# Patient Record
Sex: Male | Born: 1980 | Race: White | Hispanic: No | Marital: Single
Health system: Southern US, Community
[De-identification: ages and names within clinical notes are randomized; demographics above are authoritative.]

---

## 2010-10-27 ENCOUNTER — Emergency Department: Payer: Self-pay | Admitting: Emergency Medicine

## 2011-09-28 ENCOUNTER — Emergency Department: Payer: Self-pay | Admitting: Emergency Medicine

## 2011-12-26 ENCOUNTER — Emergency Department: Payer: Self-pay | Admitting: Emergency Medicine

## 2011-12-27 ENCOUNTER — Emergency Department: Payer: Self-pay | Admitting: Emergency Medicine

## 2014-01-20 ENCOUNTER — Inpatient Hospital Stay: Payer: Self-pay | Admitting: Internal Medicine

## 2014-01-20 LAB — CBC WITH DIFFERENTIAL/PLATELET
Basophil #: 0.1 10*3/uL (ref 0.0–0.1)
Basophil %: 0.5 %
Eosinophil #: 0.4 10*3/uL (ref 0.0–0.7)
Eosinophil %: 3.1 %
HCT: 37.4 % — ABNORMAL LOW (ref 40.0–52.0)
HGB: 12.2 g/dL — AB (ref 13.0–18.0)
LYMPHS PCT: 17 %
Lymphocyte #: 2.1 10*3/uL (ref 1.0–3.6)
MCH: 27.7 pg (ref 26.0–34.0)
MCHC: 32.6 g/dL (ref 32.0–36.0)
MCV: 85 fL (ref 80–100)
MONO ABS: 0.9 x10 3/mm (ref 0.2–1.0)
MONOS PCT: 7.2 %
Neutrophil #: 8.8 10*3/uL — ABNORMAL HIGH (ref 1.4–6.5)
Neutrophil %: 72.2 %
Platelet: 355 10*3/uL (ref 150–440)
RBC: 4.4 10*6/uL (ref 4.40–5.90)
RDW: 13.5 % (ref 11.5–14.5)
WBC: 12.2 10*3/uL — ABNORMAL HIGH (ref 3.8–10.6)

## 2014-01-20 LAB — BASIC METABOLIC PANEL
Anion Gap: 4 — ABNORMAL LOW (ref 7–16)
BUN: 11 mg/dL (ref 7–18)
CALCIUM: 8.5 mg/dL (ref 8.5–10.1)
Chloride: 99 mmol/L (ref 98–107)
Co2: 35 mmol/L — ABNORMAL HIGH (ref 21–32)
Creatinine: 0.95 mg/dL (ref 0.60–1.30)
EGFR (African American): >60
EGFR (Non-African Amer.): >60
Glucose: 94 mg/dL (ref 65–99)
Osmolality: 275 (ref 275–301)
Potassium: 4.2 mmol/L (ref 3.5–5.1)
Sodium: 138 mmol/L (ref 136–145)

## 2014-01-20 LAB — MONONUCLEOSIS SCREEN: Mono Test: NEGATIVE

## 2014-01-21 LAB — BASIC METABOLIC PANEL
ANION GAP: 5 — AB (ref 7–16)
BUN: 8 mg/dL (ref 7–18)
Calcium, Total: 8.6 mg/dL (ref 8.5–10.1)
Chloride: 104 mmol/L (ref 98–107)
Co2: 29 mmol/L (ref 21–32)
Creatinine: 0.74 mg/dL (ref 0.60–1.30)
EGFR (African American): >60
EGFR (Non-African Amer.): >60
Glucose: 132 mg/dL — ABNORMAL HIGH (ref 65–99)
Osmolality: 276 (ref 275–301)
POTASSIUM: 4 mmol/L (ref 3.5–5.1)
Sodium: 138 mmol/L (ref 136–145)

## 2014-01-21 LAB — CBC WITH DIFFERENTIAL/PLATELET
BASOS ABS: 0 10*3/uL (ref 0.0–0.1)
BASOS PCT: 0.1 %
EOS ABS: 0 10*3/uL (ref 0.0–0.7)
EOS PCT: 0.1 %
HCT: 37.5 % — AB (ref 40.0–52.0)
HGB: 12.1 g/dL — AB (ref 13.0–18.0)
LYMPHS ABS: 1 10*3/uL (ref 1.0–3.6)
Lymphocyte %: 11.3 %
MCH: 27.4 pg (ref 26.0–34.0)
MCHC: 32.3 g/dL (ref 32.0–36.0)
MCV: 85 fL (ref 80–100)
MONOS PCT: 0.7 %
Monocyte #: 0.1 x10 3/mm — ABNORMAL LOW (ref 0.2–1.0)
NEUTROS PCT: 87.8 %
Neutrophil #: 7.5 10*3/uL — ABNORMAL HIGH (ref 1.4–6.5)
PLATELETS: 344 10*3/uL (ref 150–440)
RBC: 4.42 10*6/uL (ref 4.40–5.90)
RDW: 13.6 % (ref 11.5–14.5)
WBC: 8.5 10*3/uL (ref 3.8–10.6)

## 2014-01-21 LAB — T4, FREE: Free Thyroxine: 1.58 ng/dL — ABNORMAL HIGH (ref 0.76–1.46)

## 2014-01-21 LAB — TSH: Thyroid Stimulating Horm: 0.239 u[IU]/mL — ABNORMAL LOW

## 2014-01-22 LAB — BASIC METABOLIC PANEL
ANION GAP: 2 — AB (ref 7–16)
BUN: 7 mg/dL (ref 7–18)
CALCIUM: 7.9 mg/dL — AB (ref 8.5–10.1)
CHLORIDE: 107 mmol/L (ref 98–107)
CO2: 30 mmol/L (ref 21–32)
Creatinine: 0.71 mg/dL (ref 0.60–1.30)
EGFR (African American): >60
EGFR (Non-African Amer.): >60
Glucose: 196 mg/dL — ABNORMAL HIGH (ref 65–99)
Osmolality: 281 (ref 275–301)
Potassium: 4.4 mmol/L (ref 3.5–5.1)
SODIUM: 139 mmol/L (ref 136–145)

## 2014-01-22 LAB — CBC WITH DIFFERENTIAL/PLATELET
BASOS ABS: 0 10*3/uL (ref 0.0–0.1)
BASOS PCT: 0.1 %
EOS ABS: 0 10*3/uL (ref 0.0–0.7)
EOS PCT: 0 %
HCT: 32.9 % — ABNORMAL LOW (ref 40.0–52.0)
HGB: 10.5 g/dL — AB (ref 13.0–18.0)
Lymphocyte #: 1.3 10*3/uL (ref 1.0–3.6)
Lymphocyte %: 16.3 %
MCH: 27.2 pg (ref 26.0–34.0)
MCHC: 31.9 g/dL — ABNORMAL LOW (ref 32.0–36.0)
MCV: 85 fL (ref 80–100)
MONO ABS: 0.2 x10 3/mm (ref 0.2–1.0)
Monocyte %: 2 %
NEUTROS ABS: 6.6 10*3/uL — AB (ref 1.4–6.5)
Neutrophil %: 81.6 %
Platelet: 298 10*3/uL (ref 150–440)
RBC: 3.85 10*6/uL — AB (ref 4.40–5.90)
RDW: 13.8 % (ref 11.5–14.5)
WBC: 8 10*3/uL (ref 3.8–10.6)

## 2014-01-22 LAB — VANCOMYCIN, TROUGH: Vancomycin, Trough: 14 ug/mL (ref 10–20)

## 2014-01-22 LAB — BETA STREP CULTURE(ARMC)

## 2014-01-25 LAB — CULTURE, BLOOD (SINGLE)

## 2014-02-06 ENCOUNTER — Emergency Department: Payer: Self-pay | Admitting: Student

## 2014-02-06 LAB — COMPREHENSIVE METABOLIC PANEL
ALBUMIN: 3.4 g/dL (ref 3.4–5.0)
AST: 76 U/L — AB (ref 15–37)
Alkaline Phosphatase: 80 U/L
Anion Gap: 6 — ABNORMAL LOW (ref 7–16)
BILIRUBIN TOTAL: 0.9 mg/dL (ref 0.2–1.0)
BUN: 8 mg/dL (ref 7–18)
CHLORIDE: 102 mmol/L (ref 98–107)
CREATININE: 1.04 mg/dL (ref 0.60–1.30)
Calcium, Total: 8.3 mg/dL — ABNORMAL LOW (ref 8.5–10.1)
Co2: 29 mmol/L (ref 21–32)
EGFR (Non-African Amer.): >60
Glucose: 82 mg/dL (ref 65–99)
Osmolality: 271 (ref 275–301)
Potassium: 4.1 mmol/L (ref 3.5–5.1)
SGPT (ALT): 308 U/L — ABNORMAL HIGH
SODIUM: 137 mmol/L (ref 136–145)
Total Protein: 7.8 g/dL (ref 6.4–8.2)

## 2014-02-06 LAB — CBC WITH DIFFERENTIAL/PLATELET
BASOS ABS: 0.1 10*3/uL (ref 0.0–0.1)
Basophil %: 0.3 %
EOS PCT: 0.2 %
Eosinophil #: 0 10*3/uL (ref 0.0–0.7)
HCT: 40.8 % (ref 40.0–52.0)
HGB: 13.1 g/dL (ref 13.0–18.0)
Lymphocyte #: 2.1 10*3/uL (ref 1.0–3.6)
Lymphocyte %: 11.5 %
MCH: 27.2 pg (ref 26.0–34.0)
MCHC: 32.2 g/dL (ref 32.0–36.0)
MCV: 85 fL (ref 80–100)
MONOS PCT: 6.2 %
Monocyte #: 1.1 x10 3/mm — ABNORMAL HIGH (ref 0.2–1.0)
NEUTROS ABS: 15 10*3/uL — AB (ref 1.4–6.5)
Neutrophil %: 81.8 %
Platelet: 324 10*3/uL (ref 150–440)
RBC: 4.82 10*6/uL (ref 4.40–5.90)
RDW: 15 % — ABNORMAL HIGH (ref 11.5–14.5)
WBC: 18.4 10*3/uL — ABNORMAL HIGH (ref 3.8–10.6)

## 2014-02-08 ENCOUNTER — Emergency Department: Payer: Self-pay | Admitting: Emergency Medicine

## 2014-02-08 LAB — CBC WITH DIFFERENTIAL/PLATELET
BASOS ABS: 0.1 10*3/uL (ref 0.0–0.1)
BASOS PCT: 0.8 %
EOS ABS: 0.5 10*3/uL (ref 0.0–0.7)
Eosinophil %: 7.2 %
HCT: 39.4 % — AB (ref 40.0–52.0)
HGB: 12.5 g/dL — AB (ref 13.0–18.0)
LYMPHS ABS: 1.8 10*3/uL (ref 1.0–3.6)
Lymphocyte %: 24.7 %
MCH: 27.3 pg (ref 26.0–34.0)
MCHC: 31.7 g/dL — ABNORMAL LOW (ref 32.0–36.0)
MCV: 86 fL (ref 80–100)
MONO ABS: 0.4 x10 3/mm (ref 0.2–1.0)
Monocyte %: 6.2 %
Neutrophil #: 4.4 10*3/uL (ref 1.4–6.5)
Neutrophil %: 61.1 %
Platelet: 294 10*3/uL (ref 150–440)
RBC: 4.58 10*6/uL (ref 4.40–5.90)
RDW: 15.1 % — ABNORMAL HIGH (ref 11.5–14.5)
WBC: 7.2 10*3/uL (ref 3.8–10.6)

## 2014-02-08 LAB — COMPREHENSIVE METABOLIC PANEL
AST: 94 U/L — AB (ref 15–37)
Albumin: 2.9 g/dL — ABNORMAL LOW (ref 3.4–5.0)
Alkaline Phosphatase: 67 U/L
Anion Gap: 4 — ABNORMAL LOW (ref 7–16)
BUN: 3 mg/dL — AB (ref 7–18)
Bilirubin,Total: 0.4 mg/dL (ref 0.2–1.0)
CALCIUM: 8 mg/dL — AB (ref 8.5–10.1)
CHLORIDE: 108 mmol/L — AB (ref 98–107)
Co2: 29 mmol/L (ref 21–32)
Creatinine: 0.67 mg/dL (ref 0.60–1.30)
EGFR (Non-African Amer.): >60
Glucose: 79 mg/dL (ref 65–99)
OSMOLALITY: 277 (ref 275–301)
POTASSIUM: 3.8 mmol/L (ref 3.5–5.1)
SGPT (ALT): 235 U/L — ABNORMAL HIGH
Sodium: 141 mmol/L (ref 136–145)
TOTAL PROTEIN: 6.7 g/dL (ref 6.4–8.2)

## 2014-02-11 ENCOUNTER — Emergency Department: Payer: Self-pay | Admitting: Emergency Medicine

## 2014-02-11 LAB — CULTURE, BLOOD (SINGLE)

## 2014-03-08 ENCOUNTER — Emergency Department: Payer: Self-pay | Admitting: Emergency Medicine

## 2014-03-08 LAB — URINALYSIS, COMPLETE
Bacteria: NONE SEEN
Bilirubin,UR: NEGATIVE
Blood: NEGATIVE
GLUCOSE, UR: NEGATIVE mg/dL (ref 0–75)
Hyaline Cast: 2
Ketone: NEGATIVE
Leukocyte Esterase: NEGATIVE
Nitrite: NEGATIVE
Ph: 5 (ref 4.5–8.0)
Protein: NEGATIVE
Specific Gravity: 1.023 (ref 1.003–1.030)

## 2014-03-08 LAB — CBC
HCT: 43.9 % (ref 40.0–52.0)
HGB: 14.4 g/dL (ref 13.0–18.0)
MCH: 27.5 pg (ref 26.0–34.0)
MCHC: 32.8 g/dL (ref 32.0–36.0)
MCV: 84 fL (ref 80–100)
Platelet: 277 10*3/uL (ref 150–440)
RBC: 5.22 10*6/uL (ref 4.40–5.90)
RDW: 15.2 % — AB (ref 11.5–14.5)
WBC: 9.2 10*3/uL (ref 3.8–10.6)

## 2014-03-08 LAB — DRUG SCREEN, URINE
Amphetamines, Ur Screen: NEGATIVE (ref ?–1000)
BARBITURATES, UR SCREEN: NEGATIVE (ref ?–200)
BENZODIAZEPINE, UR SCRN: NEGATIVE (ref ?–200)
CANNABINOID 50 NG, UR ~~LOC~~: POSITIVE (ref ?–50)
COCAINE METABOLITE, UR ~~LOC~~: POSITIVE (ref ?–300)
MDMA (ECSTASY) UR SCREEN: NEGATIVE (ref ?–500)
Methadone, Ur Screen: NEGATIVE (ref ?–300)
Opiate, Ur Screen: NEGATIVE (ref ?–300)
PHENCYCLIDINE (PCP) UR S: NEGATIVE (ref ?–25)
TRICYCLIC, UR SCREEN: POSITIVE (ref ?–1000)

## 2014-03-08 LAB — COMPREHENSIVE METABOLIC PANEL
ALK PHOS: 75 U/L
ALT: 55 U/L
ANION GAP: 5 — AB (ref 7–16)
Albumin: 4.2 g/dL (ref 3.4–5.0)
BUN: 7 mg/dL (ref 7–18)
Bilirubin,Total: 1.2 mg/dL — ABNORMAL HIGH (ref 0.2–1.0)
CHLORIDE: 102 mmol/L (ref 98–107)
Calcium, Total: 8.4 mg/dL — ABNORMAL LOW (ref 8.5–10.1)
Co2: 30 mmol/L (ref 21–32)
Creatinine: 1.07 mg/dL (ref 0.60–1.30)
EGFR (African American): >60
GLUCOSE: 118 mg/dL — AB (ref 65–99)
Osmolality: 273 (ref 275–301)
POTASSIUM: 3.6 mmol/L (ref 3.5–5.1)
SGOT(AST): 27 U/L (ref 15–37)
Sodium: 137 mmol/L (ref 136–145)
Total Protein: 7.7 g/dL (ref 6.4–8.2)

## 2014-03-08 LAB — SALICYLATE LEVEL: Salicylates, Serum: <1.7 mg/dL

## 2014-03-08 LAB — ETHANOL: Ethanol: 43 mg/dL (ref 0–80)

## 2014-03-08 LAB — ACETAMINOPHEN LEVEL

## 2014-09-07 NOTE — Op Note (Signed)
PATIENT NAME:  Don AlarJONES, Kharson MR#:  [id number] DATE OF BIRTH:  November 17, 1980  DATE OF PROCEDURE:  01/20/2014  PREOPERATIVE DIAGNOSIS: Epiglottitis.  POSTOPERATIVE DIAGNOSIS: Epiglottitis.   PROCEDURE: Flexible fiberoptic nasal laryngoscopy.   SURGEON: Ollen Grossaul S. Willeen CassBennett, MD   ANESTHESIA: Topical.   INDICATIONS: The patient with right neck swelling and swelling involving the epiglottis seen on CT imaging.   FINDINGS: The epiglottis had edema without erythema which extended along the right aryepiglottic fold and down into the vallecula on the right side and the adjacent right piriform sinus and tongue base without exudate. Vocal cords were mobile and the airway was mildly narrowed but ultimately patent. There was no evidence of any ulceration, erosion, or mass lesion.   DESCRIPTION OF PROCEDURE: After discussing the procedure with the patient, the left nostril was anesthetized with 4% lidocaine drops. The scope was passed through the left nasal cavity and the hypopharynx, larynx, and tongue base carefully inspected with the scope. The patient was instructed to phonate to help visualize the vocal cords and vocal cord motion, which was normal. After completion of the exam, the scope was withdrawn. The patient tolerated the procedure quite well.   ____________________________ Ollen GrossPaul S. Willeen CassBennett, MD psb:ST D: 01/20/2014 19:57:52 ET T: 01/21/2014 00:58:34 ET JOB#: [id number]  cc: Ollen GrossPaul S. Willeen CassBennett, MD, <Dictator> Sandi MealyPAUL S Jaymi Tinner MD ELECTRONICALLY SIGNED 01/30/2014 8:23

## 2014-09-07 NOTE — Consult Note (Signed)
PATIENT NAME:  Don Peck, ANDER MR#:  [id number] DATE OF BIRTH:  [birth year]  DATE OF CONSULTATION:  01/20/2014  REFERRING PHYSICIAN:  Shreyang H. Allena Katz, MD CONSULTING PHYSICIAN:  Ollen Gross. Willeen Cass, MD  REASON FOR CONSULTATION: Epiglottitis with neck swelling.   HISTORY OF PRESENTING ILLNESS: A 34 year old male who presented to the Emergency Room today with 2 days of sore throat without fever. He had developed swelling in the right neck as well. He denies any preceding cold symptoms. He was having some difficulty swallowing, but no difficulty breathing. At the time of being seen in the Emergency Room, he was having severe right neck pain. CT scan was performed which showed diffuse inflammatory changes in the right neck and right side of the hypopharynx involving the epiglottis and vallecula, although the airway was patent. There was a borderline rim-enhancing fluid collection noted lateral to the hyoid bone on the right side as well as a second area between the thyroid cartilage and the strap muscles. I reviewed the imaging and felt this most likely represented an area of intense cellulitis without definitive abscess, and the radiologist also indicated in their interpretation that actual abscess was not definitive on the imaging. The patient has been admitted to the hospital by the hospitalist service and placed on IV vancomycin, Unasyn, and dexamethasone. Apparently, he had been given 1 dose of clindamycin in the Emergency Room. He has already noted substantial improvement since the initial doses and is swallowing better, and the pain in the neck is improved markedly. He is still not having any difficulty breathing whatsoever.   PAST MEDICAL HISTORY: None.   PAST SURGICAL HISTORY: Prior ankle fracture repair and lower back surgery.   ALLERGIES: None.  OUTPATIENT MEDICATIONS: None.   SOCIAL HISTORY: Smokes a pack a day of cigarettes. Denies alcohol or drug abuse.   FAMILY HISTORY: Negative for heart  disease.  REVIEW OF SYSTEMS: He has not had any weight loss, visual difficulties, blurred vision, nasal congestion, purulent nasal discharge, chest pain, shortness of breath, palpitations, syncope, cough, wheezing, nausea, vomiting, diarrhea, rash, or urinary frequency. No history of anxiety.   PHYSICAL EXAMINATION:  VITAL SIGNS: Temperature this afternoon is 98.6, pulse 95, blood pressure is 124/70, pulse oximetry is at 99%.  GENERAL: A thin male in no acute distress, sleeping quietly in bed with no evidence of stridor when I first encountered him. He was arousable and conversive, again with no evidence of any stridor, although slightly hoarse. HEAD AND FACE: Head is normocephalic, atraumatic. No facial skin lesions. Facial strength is normal and symmetric.  EARS: External ears are unremarkable. Ear canals are free of cerumen. Tympanic membranes are clear bilaterally.  NOSE: External nose is unremarkable. Nasal cavity is clear. No purulence or polyps are seen. The septum is relatively straight. ORAL CAVITY AND OROPHARYNX: Lips are unremarkable. Tongue, floor of mouth are unremarkable, with no evidence of swelling. The posterior pharynx is free of any significant erythema or edema or exudate. He does have poor dentition.  NECK: The neck is supple with some vague fullness in the right neck anterior to the sternocleidomastoid near the submandibular gland. This area is soft, however, and nonfluctuant. There is some mild tenderness in the area. This is just adjacent and lateral to the hyoid bone, corresponding with the area that was questionable for an abscess on CT scan. Lower down along the right thyroid cartilage there is, again, some swelling there, without severe tenderness and fluctuance and no firmness. The submandibular glands are relatively soft,  although there is some of the edema on the right side that is adjacent to the submandibular gland. There are no palpable stones. The parotid glands are soft  and nontender without masses. There is no thyromegaly. EYES:  Extraocular movements are intact. He does have some strabismus.  NEUROLOGIC: Cranial nerves II through XII are grossly intact. RESPIRATORY: The patient shows normal respiratory effort without use of accessory muscles, and no labored breathing whatsoever.   PROCEDURES: Fiberoptic nasal laryngoscopy was performed at the bedside. There is some edema of the epiglottis without significant erythema, with edema extending into the right vallecula and right hypopharyngeal region. No exudate is seen. There is edema along the aryepiglottic fold on the right. Vocal cords are mobile and the airway is mildly narrowed but ultimately patent. The tongue base is unremarkable, although there is some edema in the right tongue base. No mucosal lesions are seen to suggest neoplastic involvement.   DATA REVIEW: I personally reviewed his CT scan and, again, the areas in question appeared to likely represent an area of phlegmon change rather than a definitive abscess. Rim enhancement is minimal in these 2 areas, one adjacent to the right side of the hyoid bone and one adjacent to the thyroid cartilage. His white count is modestly elevated at 12.2, with an elevated neutrophil count. Mono testing was negative.   ASSESSMENT: This patient has a reactive degree of epiglottitis. It is hard to say where the original source of inflammation came from. It very likely could have come from his teeth, rather than being a primary epiglottitis. The epiglottis itself is not erythematous, and the swelling of the epiglottis may be simply secondary to cellulitis involving the neck, with associated lymphadenitis. He is well covered with Unasyn and vancomycin. I might consider increasing his Decadron dosing slightly to help speed up resolution of his edema, however. Fortunately, his airway appears to be patent and he is not having any complaints of difficulty breathing. Based on his  examination and his rapid improvement in the few short hours he has been here, I do not think there is an indication for an attempt at incision and drainage. I had come prepared to pass a needle into the area of question, but I am not convinced I could reliably get into that area given the fairly vague area of swelling palpable at this point.   PLAN: I would recommend continued IV antibiotics and steroids. I will increase his Decadron slightly. I will recheck him tomorrow and make sure he is making progress. I am doubtful there will be any need for incision and drainage of the neck given his rapid progress, however.    ____________________________ Ollen GrossPaul S. Willeen CassBennett, MD psb:ST D: 01/20/2014 19:55:49 ET T: 01/21/2014 00:21:11 ET JOB#: [id number]  cc: Ollen GrossPaul S. Willeen CassBennett, MD, <Dictator> Sandi MealyPAUL S Yeriel Mineo MD ELECTRONICALLY SIGNED 01/30/2014 8:24

## 2014-09-07 NOTE — H&P (Signed)
PATIENT NAME:  Don Peck, TURCK MR#:  [id number] DATE OF BIRTH:  [birth year]  DATE OF ADMISSION:  01/20/2014  PRIMARY CARE PROVIDER: None.  EMERGENCY DEPARTMENT REFERRING PHYSICIAN: Verdia Kuba. Paduchowski, MD   CHIEF COMPLAINT: Sore throat, pain on the right side of the neck.   HISTORY OF PRESENT ILLNESS: The patient is a 34 year old white male with no significant medical history, reports that he started having sore throat for the past 2 days. He started also noticing swelling on the right side of his neck for the past 2 days. The patient came with these symptoms. He was noted to have an elevated leukocytosis and a CT scan which showed diffuse inflammatory changes involving multiple spaces in the right neck consistent with an acute infection. There was also a rim-enhancing fluid collection noted as well. The ED physician's assistant discussed the case with Dr. Richardson Landry, ENT, who reviewed the CT scan. He felt that there is no abscess that can be drained in the neck, but recommended the patient be admitted to the hospital for antibiotics and IV steroids. The patient reports that he has not noticed any fevers or chills. He denies any chest pains or shortness of breath. Denies any nausea, vomiting or diarrhea. Denies any urinary symptoms. He does complain of some difficulty with swallowing and pain. Denies any significant shortness of breath. He complains of severe pain in the right neck area.   PAST MEDICAL HISTORY: None.   PAST SURGICAL HISTORY: History of ankle fracture repair x 3. Status post back surgery in the lower back.   ALLERGIES: None.   CURRENT MEDICATIONS: He is not on any medications as an outpatient.  SOCIAL HISTORY: Smokes 1 pack per day. No alcohol or drug use according to him.   FAMILY HISTORY: Negative for heart disease.   REVIEW OF SYSTEMS: CONSTITUTIONAL: Complains of fevers, chills. No weight loss. No weight gain.  HEENT: Denies any visual difficulties. No blurred vision. No  redness. No inflammation. No glaucoma. No cataracts.  EARS, NOSE AND THROAT: Denies any nasal congestion, drainage. No epistaxis. Denies any difficulty with swallowing.  NECK: Complains of right-sided neck pain.  CARDIOVASCULAR: Denies any chest pains, palpitations or syncope.  PULMONARY: Denies any cough, wheezing. No COPD.  GASTROINTESTINAL: Denies any nausea, vomiting or diarrhea.  GENITOURINARY: Denies any frequency, urgency or hesitancy.  SKIN: Denies any rash.  LYMPHATICS: Complains of swelling in the right neck.  MUSCULOSKELETAL: Denies any pain and swelling in the joints.  NEUROLOGIC: No numbness, CVA, TIA or seizures.  PSYCHIATRIC: No anxiety.  LABORATORY DATA AND EVALUATIONS: CT scan of the neck shows diffuse inflammatory changes involving multiple spaces in the right neck consistent with infection, rent-enhancing fluid collection situated between the right aspect of the hyoid bone and the thyroid cartilage overlying strap muscles concerning for abscesses. Glucose 94, BUN 11, creatinine 0.95, sodium 138, potassium 4.2, chloride 99, CO2 of 35, calcium 8.5. WBC 12.2, hemoglobin 12.2, platelet count 355,000. Monospot test is negative.   ASSESSMENT AND PLAN: The patient is a 34 year old white male who presents with neck pain, noted to have significant swelling in the neck. Findings on the CT consistent with epiglottitis as well as early abscess in the neck. The ED PA spoke to Dr. Richardson Landry of ENT who reviewed the CT scan and feels there is nothing to drain and recommends antibiotics and steroids.  1.  Epiglottitis with neck abscesses. At this time, we will treat with IV Unasyn, vancomycin. Followup on blood cultures. We will also treat him  with IV Decadron. After 2 or 3 days of antibiotics, would recommend a CT scan to make sure things are improving.  2.  Nicotine addiction. The patient counseled regarding smoking cessation, 4 minutes spent. Recommended to stop smoking. The patient is not  interested in a nicotine replacement at this point; however, I will place him on nicotine cartridge as needed. Oral inhaler kit as needed.  3.  Miscellaneous. He will be on heparin for deep veinous thrombosis prophylaxis.   TIME SPENT: Forty-five minutes on this patient.    ____________________________ Lafonda Mosses. Posey Pronto, MD shp:TT D: 01/20/2014 16:28:16 ET T: 01/20/2014 16:45:46 ET JOB#: [id number]  cc: Masai Kidd H. Posey Pronto, MD, <Dictator> Alric Seton MD ELECTRONICALLY SIGNED 02/01/2014 8:37

## 2014-09-07 NOTE — Discharge Summary (Signed)
PATIENT NAME:  Peck, Don MR#:  [id number] DATE OF BIRTH:  [birth year]  DATE OF ADMISSION:  01/20/2014 DATE OFMarikay Peck DISCHARGE:  01/22/2014  PRESENTING COMPLAINT: Throat pain.   DISCHARGE DIAGNOSES: 1.  Reactive epiglottitis secondary to poor dentition/infection/dental caries.  2.  Pharyngitis.  3.  Hyperthyroidism, workup as outpatient.  4.  Tobacco abuse.   CODE STATUS: Full code.   MEDICATIONS: 1.  Clindamycin 600 mg every 8 hours.  2.  Prednisone taper.  3.  Advil 200 mg t.i.d. as needed.   DIET: Regular, mechanical.   FOLLOWUP:  1.  Dr. Tedd SiasSolum endocrinology for enlarged thyroid.  2.  Follow up with your dentist in 1 to 2 weeks as soon as possible.    CONSULTATIONS: ENT, Dr. Willeen CassBennett.   LABORATORY DATA: White count today is 8.0, hemoglobin and hematocrit 10.5 and 32.9, platelet count is 298,000. Glucose is 196, BUN 7, creatinine is 0.7, sodium is 139, potassium is 4.4, chloride is 107, bicarbonate is 30. TSH is 0.239. T4 is 1.58. Blood cultures no growth in 18 to 24 hours. Beta strep throat-like growth group A positive. Mono test is negative. CT of the neck showed diffuse inflammatory changes involving multiple spaces on the right neck consistent with infection, rim-enhancing fluid collection situated between the right aspect of the hyoid bone and between the right thyroid cartilage and overlying strap muscles are concerning for abscess.   BRIEF SUMMARY OF HOSPITAL COURSE: Don AlarDavid Peck is a 34 year old Caucasian gentleman with history of tobacco abuse, comes into the Emergency Room with:  1.  Acute epiglottitis, appears reactive, likely from poor dentition. No area obstruction or difficulty swallowing. Has been evaluated by ENT and underwent a direct flexible laryngoscope evaluation which showed improvement of swelling inflammation with IV steroids and IV antibiotics. The patient has been able to swallow soft diet well. No area  obstruction. Vitals are stable. His antibiotic has been  switched to p.o. clindamycin for which he will take about a total of 10 day course. He is recommended to follow up with dentist in the area as soon as possible to get his dental work taken care of.  The patient did voice understanding.  2.  Cellulitis of the neck, improved.  3.  Enlarged thyroid with abnormal thyroid labs. The patient will get outpatient followup with Dr. Tedd SiasSolum for his thyroid workup. This was an incidental finding on routine labs which was done as part of the hospital admission. The patient was requesting to go home. He remained afebrile. Blood cultures were negative. Hospital stay remained stable. The patient remained a full code.    TIME SPENT: 40 minutes.    ____________________________ Wylie HailSona A. Allena KatzPatel, MD sap:at D: 01/22/2014 14:22:58 ET T: 01/22/2014 16:58:34 ET JOB#: [id number]  cc: Sukhman Martine A. Allena KatzPatel, MD, <Dictator> Willow OraSONA A Calahan Pak MD ELECTRONICALLY SIGNED 01/24/2014 14:04

## 2014-09-07 NOTE — Op Note (Signed)
PATIENT NAME:  Don Peck, Don Peck MR#:  [id number] DATE OF BIRTH:  02-Dec-1980  DATE OF PROCEDURE:  01/21/2014  PREOPERATIVE DIAGNOSIS:  Epiglottitis.   POSTOPERATIVE DIAGNOSIS:  Epiglottitis.   PROCEDURE: Flexible fiber-optic nasal laryngoscopy.   SURGEON: Marion DownerScott Kenyada Dosch, MD  ANESTHESIA: Topical 4% lidocaine.   INDICATIONS: This is a follow-up from yesterday's laryngoscopy to assess the patient's response to therapy.   FINDINGS: There is only some mild edema remaining along the right aryepiglottic fold. The vocal cords are much better visualized today with no significant airway compromise whatsoever. The more severe edema that had been seen in the right vallecula has now resolved and the piriform sinus is better visualized with significant reduction of edema there. There is no erythema or exudate.   DESCRIPTION OF PROCEDURE: After discussing the procedure with the patient, the left nasal cavity was anesthetized with topical 4% lidocaine. A flexible fiber-optic scope was passed through the left nasal cavity. The scope was passed through the nasopharynx and used to evaluate the hypopharynx, larynx, and tongue base. The patient was instructed to phonate to assess vocal cord mobility, which was normal. Findings are as noted above. The scope was withdrawn. The patient tolerated the procedure well.    ____________________________ Ollen GrossPaul S. Willeen CassBennett, MD psb:nb D: 01/21/2014 16:36:00 ET T: 01/21/2014 17:59:40 ET JOB#: [id number]  cc: Ollen GrossPaul S. Willeen CassBennett, MD, <Dictator> Sandi MealyPAUL S Lasonya Hubner MD ELECTRONICALLY SIGNED 01/30/2014 8:23

## 2016-06-29 IMAGING — CT CT OF THE RIGHT ANKLE WITH CONTRAST
3 of 4 series · 13 of 33 positions shown, 16 images · IV contrast (isovue)
Comparison: Right ankle radiographs 12/27/2011.  No recent studies.

CLINICAL DATA: Right ankle pain, swelling and erythema for 2 days.
No acute injury. History of ankle fracture with fixation 10 years
ago.

EXAM:
CT OF THE  ANKLE WITH CONTRAST
TECHNIQUE: Multidetector CT imaging of the ankle was performed following the
standard protocol during bolus administration of intravenous
contrast.
CONTRAST:  100 ml Isovue 370.

[Series 9: sag soft tissue · sagittal · 0.28mm/px · 5 of 50 slices shown, 6 images]
[im 17/50  bone]
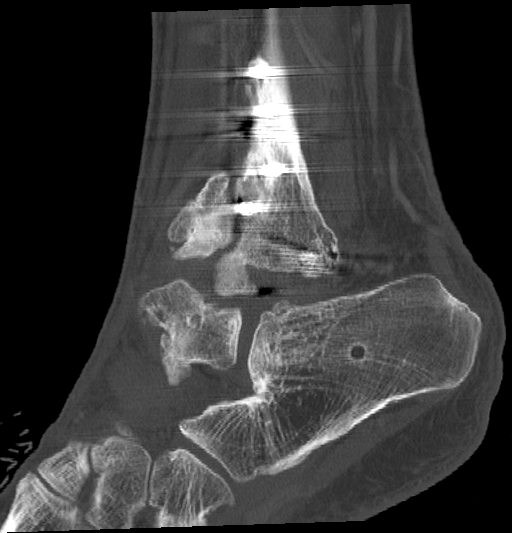
[im 21/50  bone]
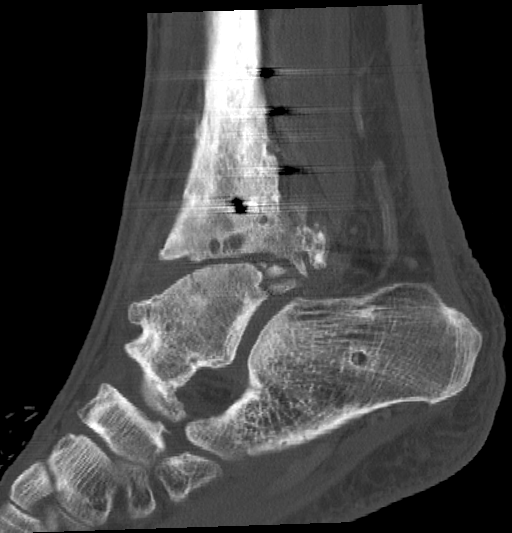
[im 25/50  soft-tissue]
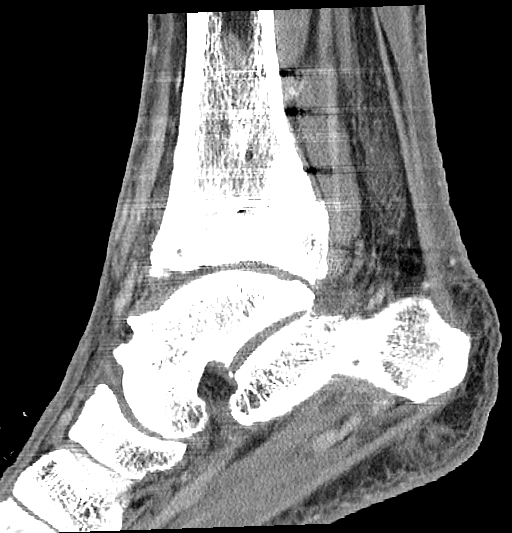
[im 25/50  bone]
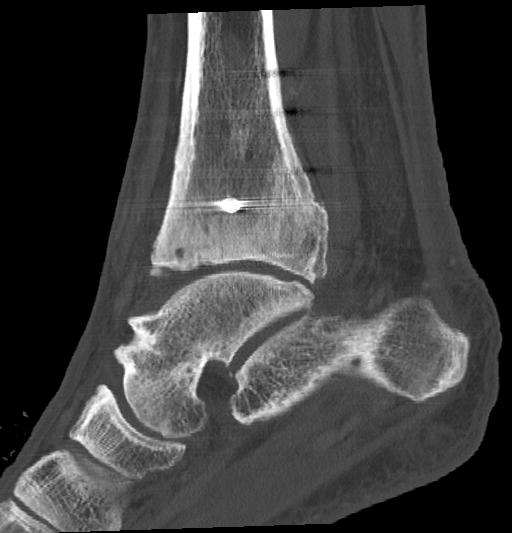
[im 29/50  bone]
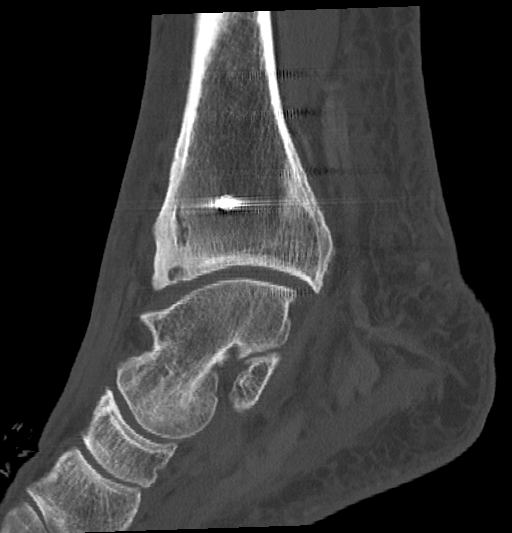
[im 33/50  bone]
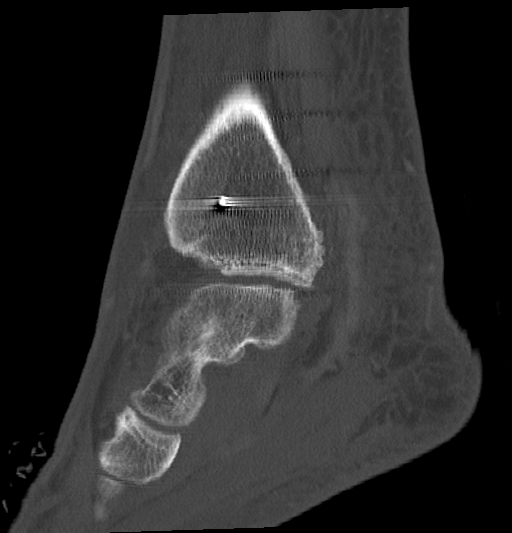

[Series 11: ax soft tissue · axial · 0.26mm/px · z∈[-110,+16]mm · 7 of 75 slices shown, 9 images]
[im 6/75  soft-tissue]
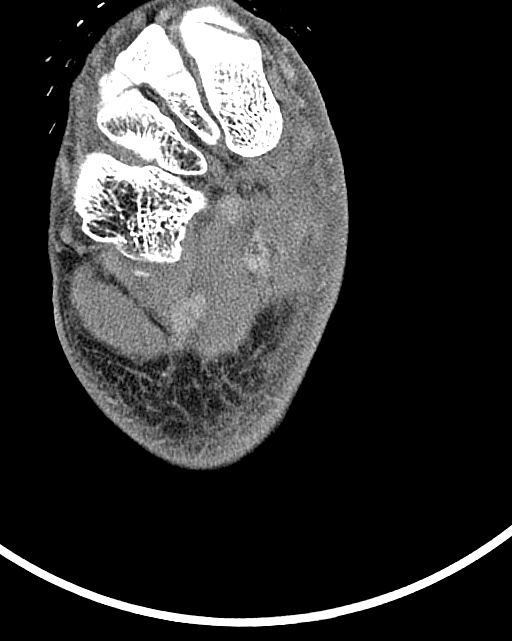
[im 6/75  bone]
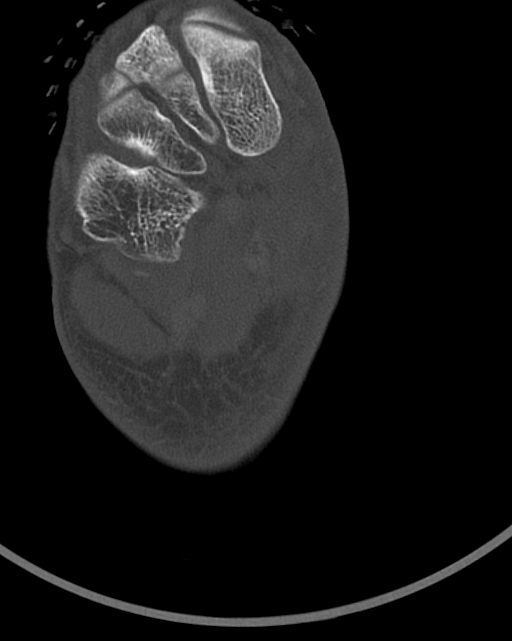
[im 18/75  bone]
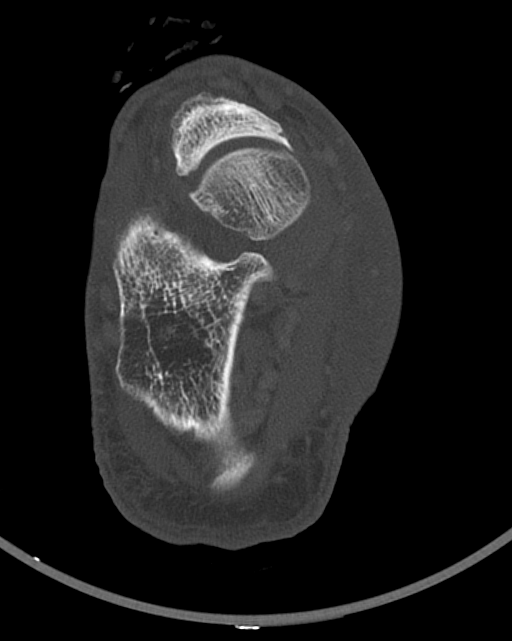
[im 29/75  bone]
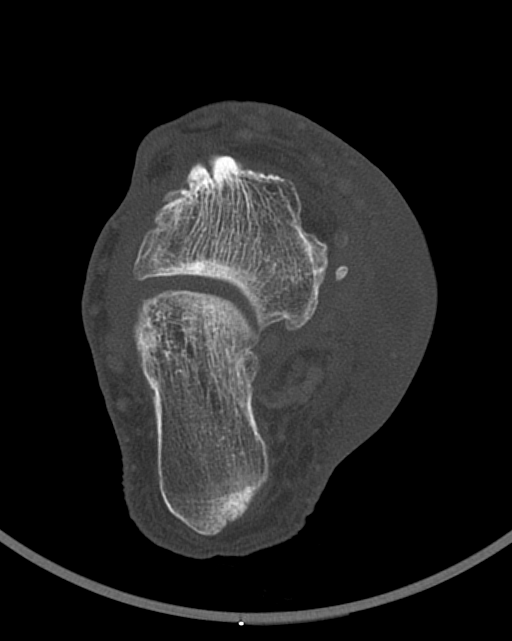
[im 40/75  bone]
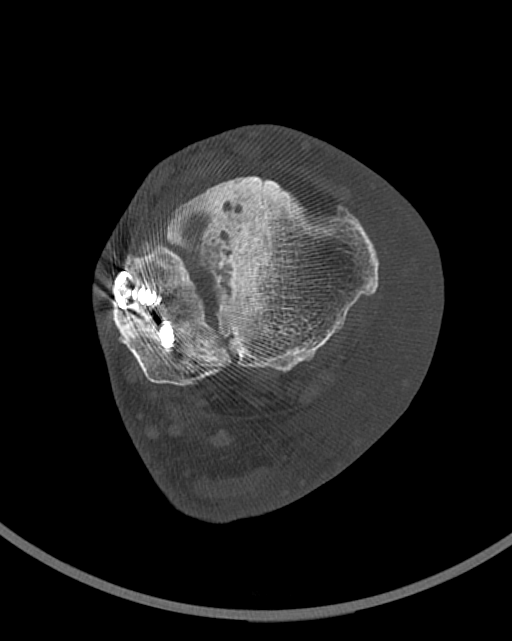
[im 46/75  soft-tissue]
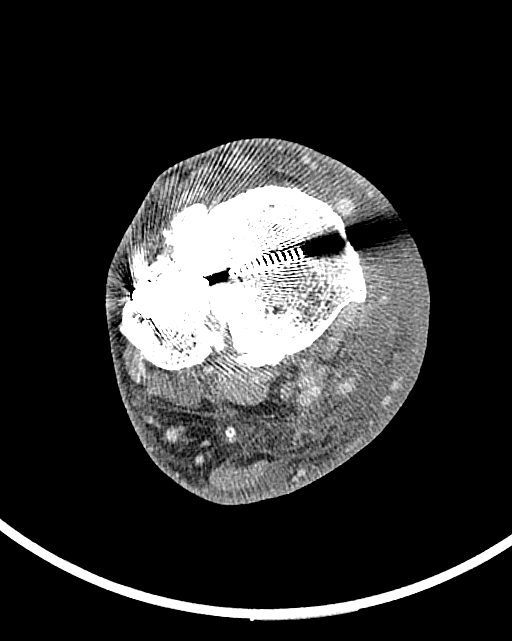
[im 46/75  bone]
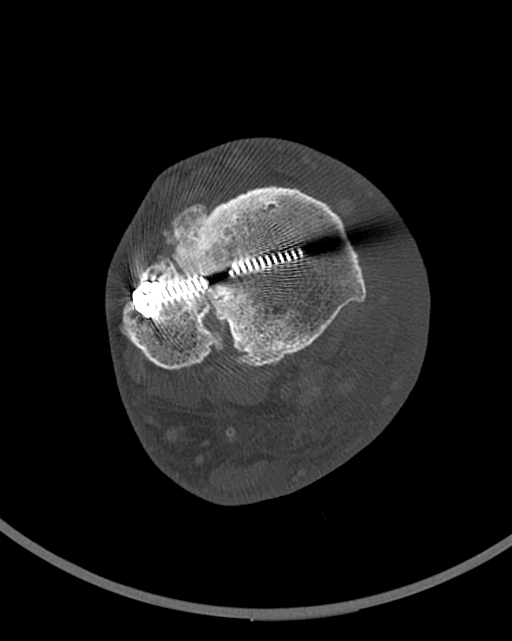
[im 57/75  bone]
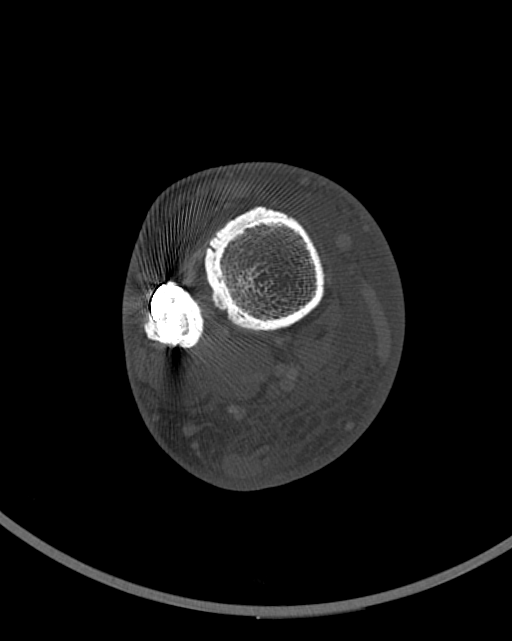
[im 69/75  bone]
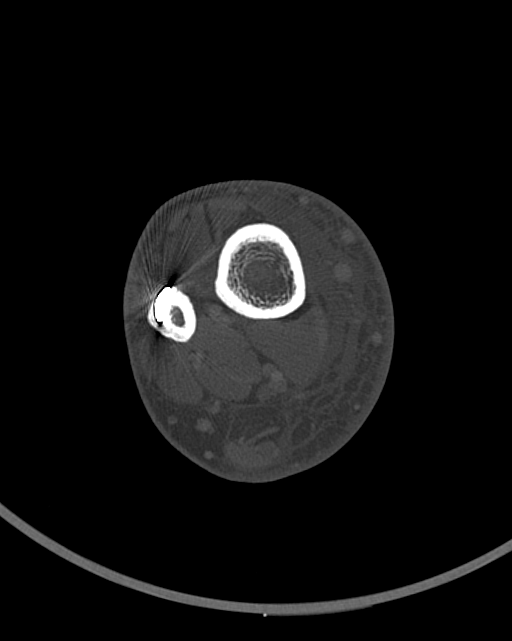

[Series 12: cor bone_ · coronal · 0.22mm/px · 1 of 70 slices shown]
[im 35/70  bone]
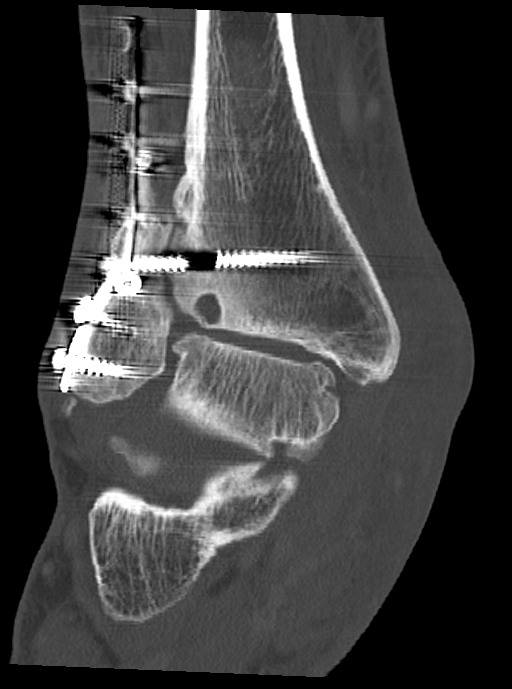

[13 of 33 positions shown; findings below may reference images not displayed]

FINDINGS: There is asymmetric subcutaneous edema medially in the right lower
leg, extending into the hindfoot and midfoot. There is an
ill-defined fluid collection within the subcutaneous fat adjacent to
the medial malleolus and talus, measuring approximately 3.3 x 1.4 cm
transverse on image 41. This extends approximately 3.9 cm cephalad
blunted and is associated with peripheral enhancement. No other
focal fluid collections are seen. There is no evidence of large
ankle joint effusion.

The ankle tendons appear intact. There is no significant
tenosynovitis.

Patient is status post distal fibular plate and screw fixation. The
hardware appears unchanged without loosening. There is a chronic
fracture of the plate distally. There is also chronic fracture of a
screw traversing the distal tibiofibular joint. There is evidence of
previous external fixation in the calcaneus.

Extensive posttraumatic deformities of the distal tibia and fibula
and secondary degenerative changes are grossly stable. There is
associated chronic periosteal reaction. No acute fracture,
dislocation or bone destruction is identified.
IMPRESSION: 1. Ill-defined superficial fluid collection with peripheral
enhancement adjacent to the medial malleolus suspicious for a soft
tissue abscess in this clinical context. No foreign body identified.
2. Stable appearance of the distal tibia and fibula status post
fracture and fixation. The distal fibular plate and a traversing
screw are chronically fractured. There are extensive tibiotalar
degenerative changes.
3. No evidence of acute osseous injury or bone destruction.
